# Patient Record
Sex: Male | Born: 2000 | Race: White | Hispanic: No | Marital: Single | Smoking: Never smoker
Health system: Southern US, Community
[De-identification: ages and names within clinical notes are randomized; demographics above are authoritative.]

## PROBLEM LIST (undated history)

## (undated) DIAGNOSIS — F419 Anxiety disorder, unspecified: Secondary | ICD-10-CM

## (undated) DIAGNOSIS — U071 COVID-19: Secondary | ICD-10-CM

---
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
## 2022-07-01 ENCOUNTER — Encounter: Payer: Self-pay | Admitting: Radiology

## 2022-11-20 IMAGING — DX DG ANKLE COMPLETE 3+V*R*
3 series · 3 of 3 positions shown · non-contrast
Comparison: 10/26/2020

CLINICAL DATA: Postreduction

EXAM:
RIGHT ANKLE - COMPLETE 3+ VIEW

[ankle ap]
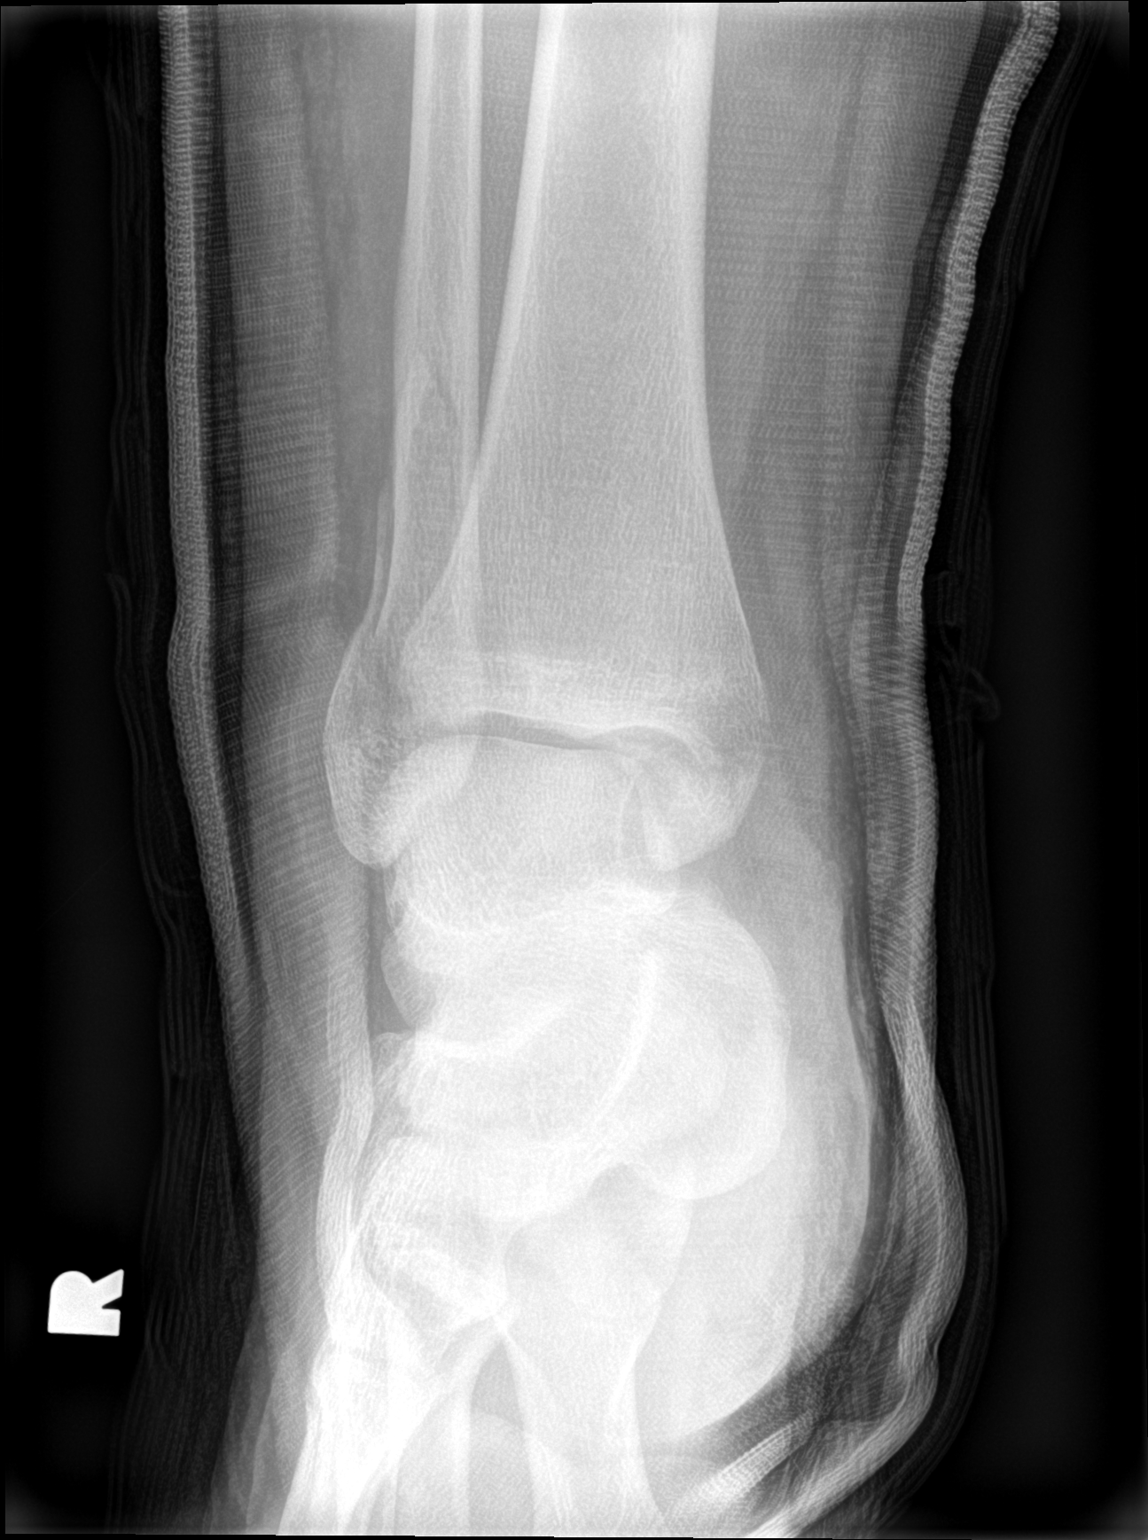

[ankle obl]
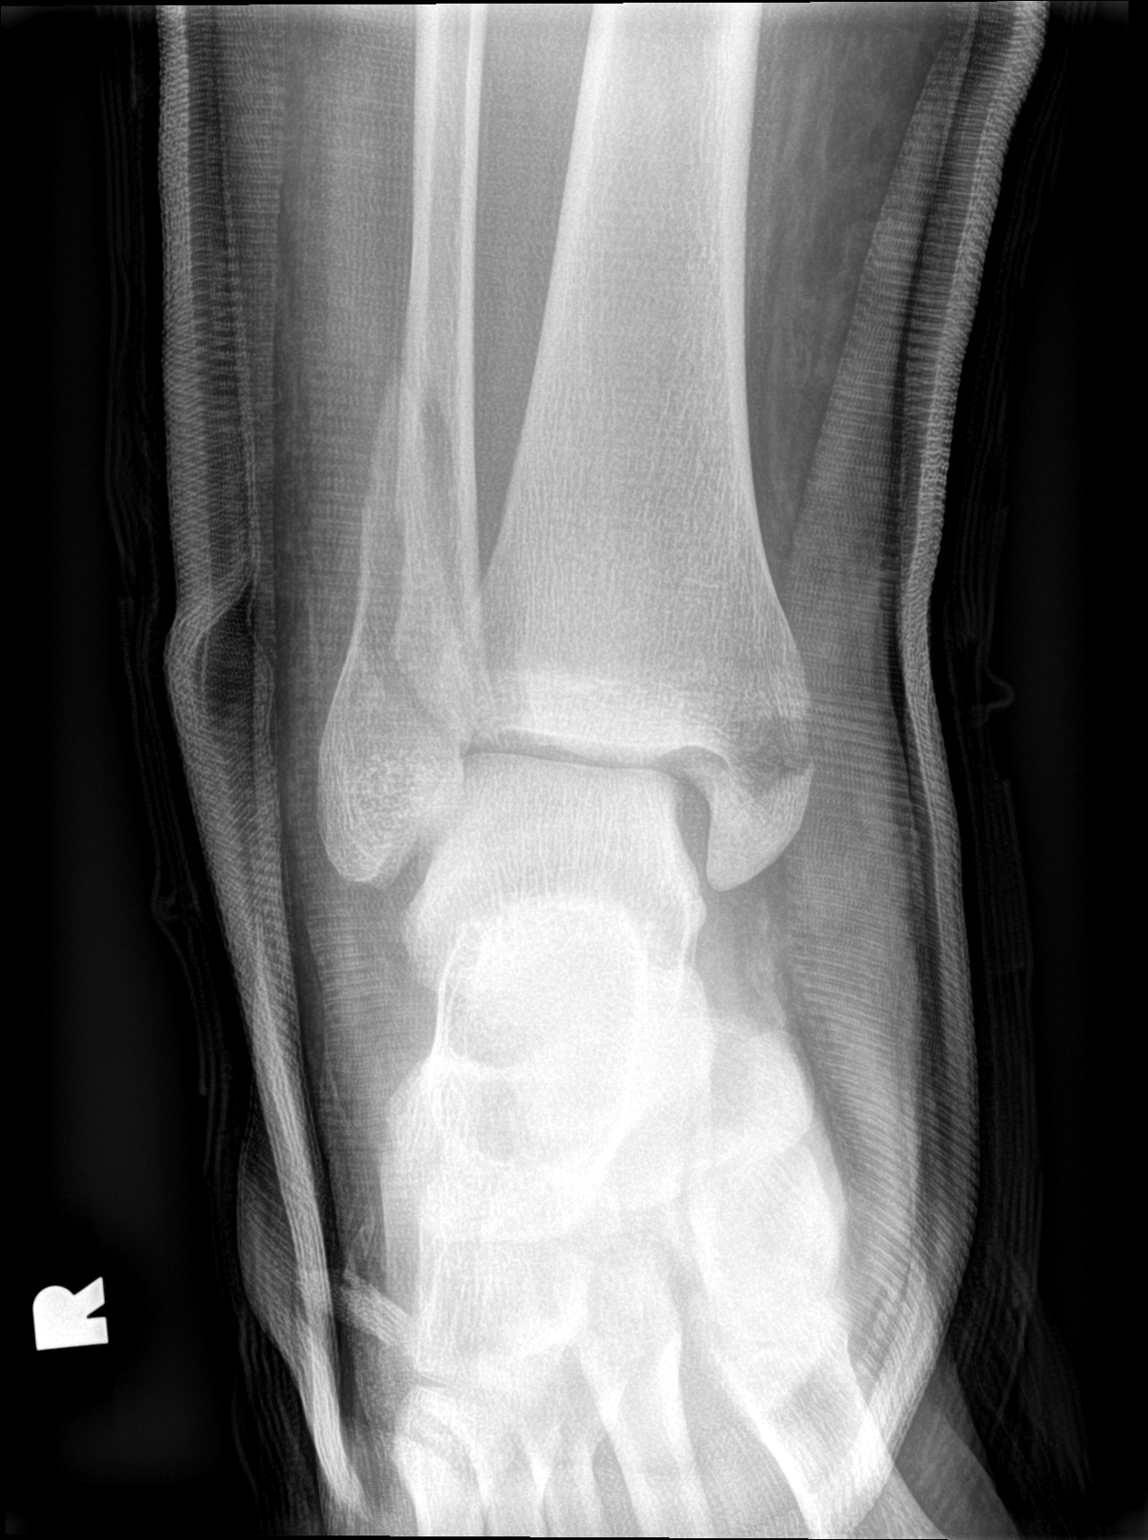

[ankle lat]
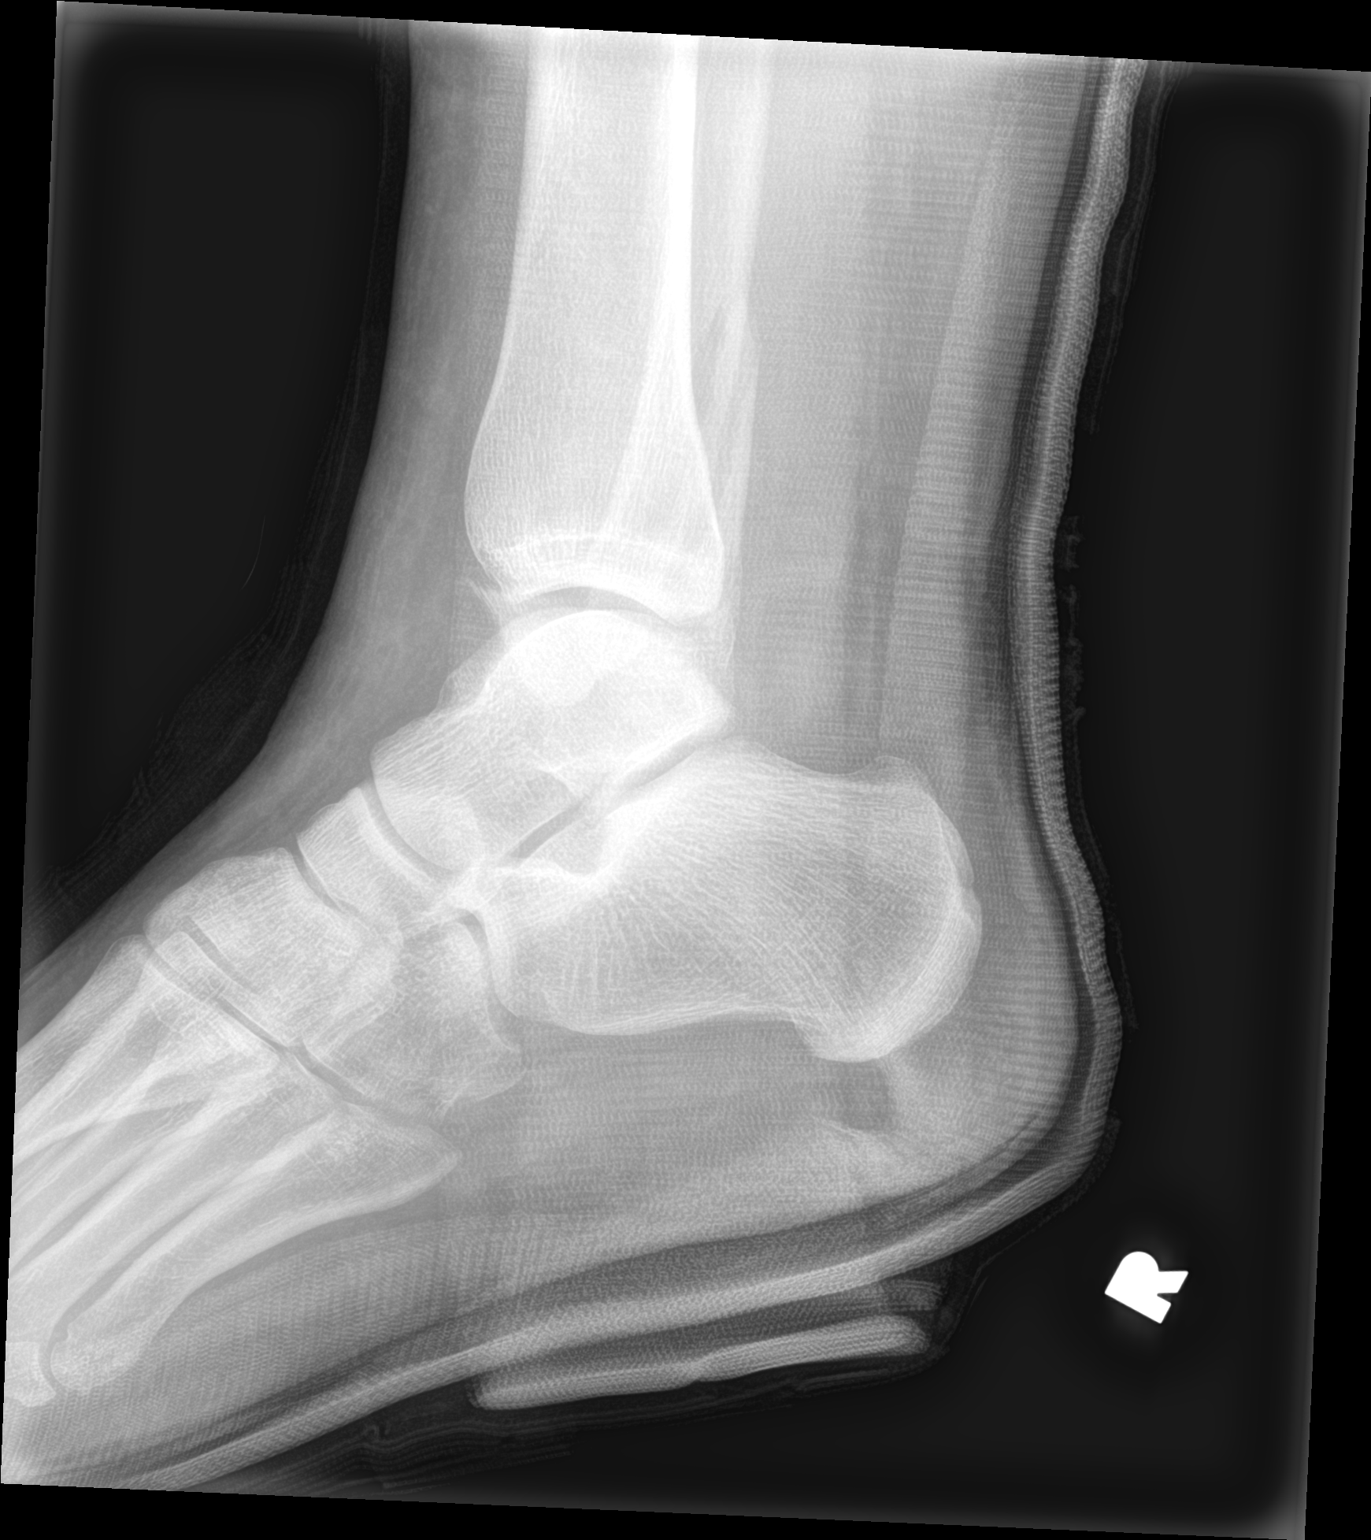

[3 of 3 positions shown; findings below may reference images not displayed]

FINDINGS: In splint views of the right ankle demonstrate interval reduction of
the previously seen distal tibia and fibular fractures with
displacement. Improving alignment with continued mild displacement.
IMPRESSION: Improving alignment with mild continued displacement of the distal
fibular and medial malleolar fractures.

## 2023-05-13 ENCOUNTER — Other Ambulatory Visit: Payer: Self-pay

## 2023-05-13 ENCOUNTER — Emergency Department (HOSPITAL_BASED_OUTPATIENT_CLINIC_OR_DEPARTMENT_OTHER): Payer: BC Managed Care – PPO | Admitting: Radiology

## 2023-05-13 ENCOUNTER — Emergency Department (HOSPITAL_BASED_OUTPATIENT_CLINIC_OR_DEPARTMENT_OTHER)
Admission: EM | Admit: 2023-05-13 | Discharge: 2023-05-13 | Disposition: A | Discharge status: Home / Self Care | Payer: BC Managed Care – PPO | Attending: Emergency Medicine | Admitting: Emergency Medicine

## 2023-05-13 DIAGNOSIS — R079 Chest pain, unspecified: Secondary | ICD-10-CM | POA: Diagnosis present

## 2023-05-13 DIAGNOSIS — R7401 Elevation of levels of liver transaminase levels: Secondary | ICD-10-CM | POA: Diagnosis not present

## 2023-05-13 DIAGNOSIS — R0789 Other chest pain: Secondary | ICD-10-CM | POA: Insufficient documentation

## 2023-05-13 DIAGNOSIS — R000 Tachycardia, unspecified: Secondary | ICD-10-CM | POA: Diagnosis not present

## 2023-05-13 LAB — D-DIMER, QUANTITATIVE: D-Dimer, Quant: <0.27 ug{FEU}/mL (ref 0.00–0.50)

## 2023-05-13 LAB — LIPASE, BLOOD: Lipase: 16 U/L (ref 11–51)

## 2023-05-13 LAB — COMPREHENSIVE METABOLIC PANEL
ALT: 87 U/L — ABNORMAL HIGH (ref 0–44)
AST: 42 U/L — ABNORMAL HIGH (ref 15–41)
Albumin: 5.2 g/dL — ABNORMAL HIGH (ref 3.5–5.0)
Alkaline Phosphatase: 88 U/L (ref 38–126)
Anion gap: 13 (ref 5–15)
BUN: 10 mg/dL (ref 6–20)
CO2: 23 mmol/L (ref 22–32)
Calcium: 10.3 mg/dL (ref 8.9–10.3)
Chloride: 100 mmol/L (ref 98–111)
Creatinine, Ser: 1.07 mg/dL (ref 0.61–1.24)
GFR, Estimated: >60 mL/min (ref >60)
Glucose, Bld: 106 mg/dL — ABNORMAL HIGH (ref 70–99)
Potassium: 3.7 mmol/L (ref 3.5–5.1)
Sodium: 136 mmol/L (ref 135–145)
Total Bilirubin: 1.1 mg/dL (ref 0.0–1.2)
Total Protein: 8.1 g/dL (ref 6.5–8.1)

## 2023-05-13 LAB — CBC
HCT: 49.2 % (ref 39.0–52.0)
Hemoglobin: 17.5 g/dL — ABNORMAL HIGH (ref 13.0–17.0)
MCH: 29.3 pg (ref 26.0–34.0)
MCHC: 35.6 g/dL (ref 30.0–36.0)
MCV: 82.3 fL (ref 80.0–100.0)
Platelets: 370 10*3/uL (ref 150–400)
RBC: 5.98 MIL/uL — ABNORMAL HIGH (ref 4.22–5.81)
RDW: 12.5 % (ref 11.5–15.5)
WBC: 10.1 10*3/uL (ref 4.0–10.5)
nRBC: 0 % (ref 0.0–0.2)

## 2023-05-13 LAB — TROPONIN I (HIGH SENSITIVITY): Troponin I (High Sensitivity): 3 ng/L (ref <18)

## 2023-05-13 MED ORDER — ACETAMINOPHEN 500 MG PO TABS
1000.0000 mg | ORAL_TABLET | Freq: Once | ORAL | Status: AC
  Administered 2023-05-13: 1000 mg via ORAL
  Filled 2023-05-13: qty 2

## 2023-05-13 MED ORDER — ALUM & MAG HYDROXIDE-SIMETH 200-200-20 MG/5ML PO SUSP
30.0000 mL | Freq: Once | ORAL | Status: AC
  Administered 2023-05-13: 30 mL via ORAL
  Filled 2023-05-13: qty 30

## 2023-05-13 MED ORDER — LORAZEPAM 2 MG/ML IJ SOLN
1.0000 mg | Freq: Once | INTRAMUSCULAR | Status: AC
  Administered 2023-05-13: 1 mg via INTRAVENOUS
  Filled 2023-05-13: qty 1

## 2023-05-13 MED ORDER — LIDOCAINE VISCOUS HCL 2 % MT SOLN
15.0000 mL | Freq: Once | OROMUCOSAL | Status: AC
  Administered 2023-05-13: 15 mL via ORAL
  Filled 2023-05-13: qty 15

## 2023-05-13 NOTE — Discharge Instructions (Addendum)
 Your workup today is reassuring. It is very unlikely that your pain is due to an issue in your heart.  Your cardiac enzyme (troponin) was normal today. Your EKG which is a measure of the heart's electrical activity is normal today aside from a slightly faster rate than normal, but this appears to be your baseline. These would both show abnormalities if you were having a heart attack.  Your D dimer is normal. This means there are no signs of a blood clot.  Your chest x-ray is normal today. Your abdominal x-ray from earlier shows no signs of a bowel obstruction.  You had a slight elevation in your liver enzymes today.  However, it appears your PCP is already aware of this. Please continue to have this followed by your PCP. Your blood counts, electrolytes, kidney, and pancreas labs are normal.  Drink plenty of fluids at home and eat a fiber fillet diet (lots of fruits and vegetables). Please engage in daily exercise as this also helps to maintain regular bowel movements.   Try to use the bathroom after eating a meal. Place your feet up on a small stepstool when trying to have a bowel movement.  Take 3 scoops of Miralax in the morning and 3 in the evening until you have a bowel movement. You may also take Ducolax (Bisacodyl) 2 tablets (10mg ) once daily in the morning. These are medications you can get over the counter at any drugstore.   Please follow up with your PCP within the next month to address strategies to prevent constipation in the future.   Return to the ER if you have any difficulty breathing, worsening chest pain, dizziness, jaw pain, left arm or shoulder pain, abdominal pain, fever, if you do not have a bowel movement within the next 3 days, you begin vomiting, any other new or concerning symptoms.

## 2023-05-13 NOTE — ED Triage Notes (Signed)
 Lower abd x1 week. CP x2 days. PCP abd Xray negative. Sent for follow-up. Recently start maunjaro.

## 2023-05-13 NOTE — ED Provider Notes (Signed)
 [place] [facility] AT [facility] Provider Note   CSN: [id number] Arrival date & time: 05/13/23  1459     History  Chief Complaint  Patient presents with   Abdominal Pain    [facility] is a 23 y.o. male with history of anxiety, presents with concern for not being able to have a bowel movement for the past 8 days or so. Reports he has not been passing gas. Reports decreased appetite but denies any nausea or vomiting. Took Colace yesterday without improvement of symptoms.  Reports intermittent generalized abdominal pain, but felt this was related to recently starting Mounjaro about 3 months ago.  Was at PCP earlier today and received abdominal x-ray which showed no acute abnormalities, but was recommended to come here for further evaluation.  Also reports concern for left-sided chest pain that started about 2 days ago.  Nonexertional, nonpleuritic.  Does not change with position.  Patient states he feels like this is due to his anxiety. Denies any shortness of breath, leg pain or swelling, denies any history of blood clots or recent periods of immobilization.   HPI     Home Medications Prior to Admission medications   Medication Sig Start Date End Date Taking? Authorizing Provider  HYDROcodone -acetaminophen  (NORCO/VICODIN) 5-325 MG tablet Take 1 tablet by mouth every 6 (six) hours as needed for severe pain. 10/26/20   Joldersma, Logan, PA-C      Allergies    Patient has no known allergies.    Review of Systems   Review of Systems  Respiratory:  Negative for shortness of breath.   Cardiovascular:  Positive for chest pain.  Gastrointestinal:  Positive for abdominal pain.    Physical Exam Updated Vital Signs BP (!) 127/96   Pulse (!) 104   Resp 14   SpO2 100%  Physical Exam Vitals and nursing note reviewed.  Constitutional:      General: He is not in acute distress.    Appearance: He is well-developed.     Comments: Anxious appearing  HENT:      Head: Normocephalic and atraumatic.  Eyes:     Conjunctiva/sclera: Conjunctivae normal.  Cardiovascular:     Rate and Rhythm: Regular rhythm. Tachycardia present.     Heart sounds: No murmur heard. Pulmonary:     Effort: Pulmonary effort is normal. No respiratory distress.     Breath sounds: Normal breath sounds.  Abdominal:     Palpations: Abdomen is soft.     Tenderness: There is no abdominal tenderness.     Comments: Abdomen soft and non-tender   Musculoskeletal:        General: No swelling.     Cervical back: Neck supple.  Skin:    General: Skin is warm and dry.     Capillary Refill: Capillary refill takes less than 2 seconds.  Neurological:     Mental Status: He is alert.  Psychiatric:        Mood and Affect: Mood normal.     ED Results / Procedures / Treatments   Labs (all labs ordered are listed, but only abnormal results are displayed) Labs Reviewed  CBC - Abnormal; Notable for the following components:      Result Value   RBC 5.98 (*)    Hemoglobin 17.5 (*)    All other components within normal limits  COMPREHENSIVE METABOLIC PANEL - Abnormal; Notable for the following components:   Glucose, Bld 106 (*)    Albumin 5.2 (*)    AST  42 (*)    ALT 87 (*)    All other components within normal limits  LIPASE, BLOOD  D-DIMER, QUANTITATIVE  URINALYSIS, ROUTINE W REFLEX MICROSCOPIC  TROPONIN I (HIGH SENSITIVITY)    EKG None  Radiology DG Chest 2 View Result Date: 05/13/2023 CLINICAL DATA:  Chest pain. EXAM: CHEST - 2 VIEW COMPARISON:  None Available. FINDINGS: Bilateral lung fields are clear. Bilateral costophrenic angles are clear. Normal cardio-mediastinal silhouette. No acute osseous abnormalities. The soft tissues are within normal limits. IMPRESSION: No active cardiopulmonary disease. Electronically Signed   By: Ree Molt M.D.   On: 05/13/2023 16:13    Procedures Procedures    Medications Ordered in ED Medications  LORazepam  (ATIVAN ) injection 1  mg (1 mg Intravenous Given 05/13/23 1619)  alum & mag hydroxide-simeth (MAALOX/MYLANTA) 200-200-20 MG/5ML suspension 30 mL (30 mLs Oral Given 05/13/23 1619)    And  lidocaine  (XYLOCAINE ) 2 % viscous mouth solution 15 mL (15 mLs Oral Given 05/13/23 1619)  acetaminophen  (TYLENOL ) tablet 1,000 mg (1,000 mg Oral Given 05/13/23 1618)    ED Course/ Medical Decision Making/ A&P                                 Medical Decision Making Amount and/or Complexity of Data Reviewed Labs: ordered. Radiology: ordered.  Risk OTC drugs. Prescription drug management.     Differential diagnosis includes but is not limited to Constipation, anxiety, ACS, arrhythmia, Cholelithiasis, cholangitis, choledocholithiasis, peptic ulcer, gastritis, gastroenteritis, appendicitis, IBS, IBD, DKA, nephrolithiasis, UTI, pyelonephritis, pancreatitis, diverticulitis, mesenteric ischemia, abdominal aortic aneurysm, small bowel obstruction, volvulus, testicular torsion, ovarian torsion, and females of childbearing age pregnancy   ED Course:  Patient anxious but well appearing. HR elevated at 147, BP elevated at 152/136.  Upon review of prior PCP visits, it appears he struggles with anxiety and feelings of chest pressure often.  Heart rates at the last couple of PCP visits have also been tachycardic at 110, 117, and 160 at visit today.  His heart rate today in the ER at 147 appears to be somewhat at baseline.  CMP does reveal slightly elevated AST and ALT, however, he is being followed by his PCP for elevated LFTs, most recent visit for this was 03/28/2023.  Creatinine within normal limits.  Bilirubin and alk phos within normal limits.  Electrolytes within normal limits.  CBC without any leukocytosis.  Lipase within normal limits.  Abdomen soft nontender upon palpation.  I also reviewed the abdominal x-ray results from earlier today which showed no signs of bowel obstruction.  Very low concern for any acute abdominal pathology.  No  indication for further imaging at this time. Patient does report left-sided chest pain that has been constant for the past 2 days.  EKG with sinus tachycardia, but no signs of ischemia.  Troponin of 3.  Pain nonpleuritic, nonexertional, low concern for ACS at this time.  Chest x-ray without any acute abnormalities. Patient given Maalox, Tylenol  for chest discomfort/pain.  Also given Ativan  for anxiety. Upon re-evaluation, patient heart rate improved to 104.  He states he is normally tachycardic between 90 and 110 at home.  This seems to be his baseline according to PCP notes as well.  D-dimer undetectable, no concern for DVT or PE at this time. Patient stable and appropriate for discharge home.   Impression: Anxiety Constipation  Disposition:  The patient was discharged home with instructions to follow-up with PCP for further  management of anxiety.  Discussed use of MiraLAX and Dulcolax at home for constipation. Return precautions given.  Imaging Studies ordered: I ordered imaging studies including chest x-ray I independently visualized the imaging with scope of interpretation limited to determining acute life threatening conditions related to emergency care. Imaging showed no acute abnormalities I agree with the radiologist interpretation   Cardiac Monitoring: / EKG: The patient was maintained on a cardiac monitor.  I personally viewed and interpreted the cardiac monitored which showed an underlying rhythm of: Sinus tachycardia  External records from outside source obtained and reviewed including PCP note from earlier today where HR was in the 160s. Has multiple PCP visits for chest tightness and anxiety. Appears that HR during PCP visits chronically tachycardic  Abdominal x-ray from earlier today reviewed, findings as below: FINDINGS: #  The lungs are clear. No pneumothorax or pleural effusion. Heart and mediastinum unremarkable.  #  No free air evident beneath the diaphragm. No  radiographic evidence of significant constipation. Nonspecific bowel gas pattern. Some gas-filled small bowel loops are noted within the midabdomen.                 Final Clinical Impression(s) / ED Diagnoses Final diagnoses:  Atypical chest pain    Rx / DC Orders ED Discharge Orders     None         Veta Palma, PA-C 05/13/23 1704    Pamella Ozell LABOR, DO 05/13/23 2329

## 2023-08-13 ENCOUNTER — Encounter (HOSPITAL_BASED_OUTPATIENT_CLINIC_OR_DEPARTMENT_OTHER): Payer: Self-pay

## 2023-08-13 ENCOUNTER — Observation Stay (HOSPITAL_BASED_OUTPATIENT_CLINIC_OR_DEPARTMENT_OTHER)
Admission: EM | Admit: 2023-08-13 | Discharge: 2023-08-14 | Disposition: A | Discharge status: Home / Self Care | Attending: Internal Medicine | Admitting: Internal Medicine

## 2023-08-13 ENCOUNTER — Emergency Department (HOSPITAL_BASED_OUTPATIENT_CLINIC_OR_DEPARTMENT_OTHER)

## 2023-08-13 ENCOUNTER — Other Ambulatory Visit: Payer: Self-pay

## 2023-08-13 DIAGNOSIS — A084 Viral intestinal infection, unspecified: Secondary | ICD-10-CM | POA: Diagnosis not present

## 2023-08-13 DIAGNOSIS — Z8616 Personal history of COVID-19: Secondary | ICD-10-CM | POA: Diagnosis not present

## 2023-08-13 DIAGNOSIS — R112 Nausea with vomiting, unspecified: Secondary | ICD-10-CM | POA: Diagnosis present

## 2023-08-13 DIAGNOSIS — D72829 Elevated white blood cell count, unspecified: Secondary | ICD-10-CM

## 2023-08-13 DIAGNOSIS — Z79899 Other long term (current) drug therapy: Secondary | ICD-10-CM | POA: Diagnosis not present

## 2023-08-13 DIAGNOSIS — R42 Dizziness and giddiness: Secondary | ICD-10-CM

## 2023-08-13 HISTORY — DX: Anxiety disorder, unspecified: F41.9

## 2023-08-13 LAB — CBC
HCT: 55.5 % — ABNORMAL HIGH (ref 39.0–52.0)
Hemoglobin: 19.6 g/dL — ABNORMAL HIGH (ref 13.0–17.0)
MCH: 29.1 pg (ref 26.0–34.0)
MCHC: 35.3 g/dL (ref 30.0–36.0)
MCV: 82.5 fL (ref 80.0–100.0)
Platelets: 448 10*3/uL — ABNORMAL HIGH (ref 150–400)
RBC: 6.73 MIL/uL — ABNORMAL HIGH (ref 4.22–5.81)
RDW: 13 % (ref 11.5–15.5)
WBC: 25.9 10*3/uL — ABNORMAL HIGH (ref 4.0–10.5)
nRBC: 0 % (ref 0.0–0.2)

## 2023-08-13 LAB — RESP PANEL BY RT-PCR (RSV, FLU A&B, COVID)  RVPGX2
Influenza A by PCR: NEGATIVE
Influenza B by PCR: NEGATIVE
Resp Syncytial Virus by PCR: NEGATIVE
SARS Coronavirus 2 by RT PCR: NEGATIVE

## 2023-08-13 LAB — BASIC METABOLIC PANEL WITH GFR
Anion gap: 19 — ABNORMAL HIGH (ref 5–15)
BUN: 16 mg/dL (ref 6–20)
CO2: 20 mmol/L — ABNORMAL LOW (ref 22–32)
Calcium: 11 mg/dL — ABNORMAL HIGH (ref 8.9–10.3)
Chloride: 100 mmol/L (ref 98–111)
Creatinine, Ser: 1.1 mg/dL (ref 0.61–1.24)
GFR, Estimated: >60 mL/min (ref >60)
Glucose, Bld: 113 mg/dL — ABNORMAL HIGH (ref 70–99)
Potassium: 3.6 mmol/L (ref 3.5–5.1)
Sodium: 139 mmol/L (ref 135–145)

## 2023-08-13 LAB — LACTIC ACID, PLASMA: Lactic Acid, Venous: 1.8 mmol/L (ref 0.5–1.9)

## 2023-08-13 LAB — TROPONIN I (HIGH SENSITIVITY): Troponin I (High Sensitivity): 5 ng/L (ref <18)

## 2023-08-13 MED ORDER — SODIUM CHLORIDE 0.9 % IV BOLUS
1000.0000 mL | Freq: Once | INTRAVENOUS | Status: AC
  Administered 2023-08-13: 1000 mL via INTRAVENOUS

## 2023-08-13 MED ORDER — DIAZEPAM 5 MG/ML IJ SOLN
5.0000 mg | Freq: Once | INTRAMUSCULAR | Status: AC
  Administered 2023-08-13: 5 mg via INTRAVENOUS
  Filled 2023-08-13: qty 2

## 2023-08-13 MED ORDER — LACTATED RINGERS IV BOLUS
1000.0000 mL | Freq: Once | INTRAVENOUS | Status: AC
  Administered 2023-08-13: 1000 mL via INTRAVENOUS

## 2023-08-13 MED ORDER — ONDANSETRON HCL 4 MG/2ML IJ SOLN
4.0000 mg | Freq: Once | INTRAMUSCULAR | Status: AC
  Administered 2023-08-13: 4 mg via INTRAVENOUS
  Filled 2023-08-13: qty 2

## 2023-08-13 MED ORDER — IOHEXOL 350 MG/ML SOLN
100.0000 mL | Freq: Once | INTRAVENOUS | Status: AC | PRN
  Administered 2023-08-13: 75 mL via INTRAVENOUS

## 2023-08-13 NOTE — ED Triage Notes (Signed)
 Pt reports that he has had dizziness and vomiting with feeling like he is on verge of passing out . Pt is tachycardic during triage. Pt is pale and diaphoretic, as well. Pt has history of anxiety and reports that his anxiety is worse today.

## 2023-08-13 NOTE — ED Provider Notes (Incomplete)
 [place] EM[facility] DEPART[facility] AT [facility] Provider Note   CSN: [id number] Arrival date & time: 08/13/23  2116     History {Add pertinent medical, surgical, social history, OB history to HPI:1} Chief Complaint  Patient presents with   Dizziness    Chad [facility] is a 23 y.o. male.  23 year old male with no reported past medical history presenting to the emergency department today with dizziness.  The patient states that this been going on for the past 48 hours.  The patient states that he had a lot of diarrhea yesterday.  He states that he did have a few loose stools earlier today but started with some vomiting today as well.  He reports that his vomit has been nonbloody and nonbilious.  Denies any blood in his stool or dark stools.  He states that he is not really having a lot of abdominal discomfort with this.  He went to urgent care yesterday and was prescribed meclizine.  He is taking this 3 times a day as well as hydroxyzine and his symptoms have not improved.  He denies any focal weakness, numbness, or tingling.  He states that he is having a mild frontal headache but denies any severe headache or sudden onset headache.  Denies any neck pain or stiffness.  He denies any associated abdominal pain.   Dizziness Associated symptoms: diarrhea and vomiting        Home Medications Prior to Admission medications   Medication Sig Start Date End Date Taking? Authorizing Provider  HYDROcodone-acetaminophen (NORCO/VICODIN) 5-325 MG tablet Take 1 tablet by mouth every 6 (six) hours as needed for severe pain. 10/26/20   Joldersma, Logan, PA-C      Allergies    Patient has no known allergies.    Review of Systems   Review of Systems  Gastrointestinal:  Positive for diarrhea and vomiting.  Neurological:  Positive for dizziness.  All other systems reviewed and are negative.   Physical Exam Updated Vital Signs BP 132/86   Pulse (!) 116   Temp 97.7 F (36.5 C) (Oral)    Resp 12   SpO2 100%  Physical Exam Vitals and nursing note reviewed.   Gen: NAD Eyes: PERRL, EOMI HEENT: no oropharyngeal swelling, dry mucous membranes Neck: trachea midline, no meningismus Resp: clear to auscultation bilaterally Card: RRR, no murmurs, rubs, or gallops Abd: nontender, nondistended Extremities: no calf tenderness, no edema Vascular: 2+ radial pulses bilaterally, 2+ DP pulses bilaterally Neuro: cranial nerves intact, equal strength and sensation throughout bilateral upper and lower extremities, no dysmetria on finger-to-nose testing, no dysmetria on heel-to-shin testing Skin: no rashes Psyc: acting appropriately   ED Results / Procedures / Treatments   Labs (all labs ordered are listed, but only abnormal results are displayed) Labs Reviewed  BASIC METABOLIC PANEL WITH GFR - Abnormal; Notable for the following components:      Result Value   CO2 20 (*)    Glucose, Bld 113 (*)    Calcium 11.0 (*)    Anion gap 19 (*)    All other components within normal limits  CBC - Abnormal; Notable for the following components:   WBC 25.9 (*)    RBC 6.73 (*)    Hemoglobin 19.6 (*)    HCT 55.5 (*)    Platelets 448 (*)    All other components within normal limits  RESP PANEL BY RT-PCR (RSV, FLU A&B, COVID)  RVPGX2  LACTIC ACID, PLASMA  LACTIC ACID, PLASMA  RAPID URINE DRUG SCREEN,  HOSP PERFORMED  TROPONIN I (HIGH SENSITIVITY)    EKG EKG Interpretation Date/Time:  Saturday August 13 2023 21:31:46 EDT Ventricular Rate:  162 PR Interval:  98 QRS Duration:  84 QT Interval:  272 QTC Calculation: 446 R Axis:   0  Text Interpretation: Narrow complex tachycardia, no STEMI Otherwise normal ECG When compared with ECG of 13-May-2023 15:19, PREVIOUS ECG IS PRESENT Confirmed by Abner Hoffman [phone]) on 08/13/2023 9:40:11 PM  Radiology No results found.  Procedures Procedures  {Document cardiac monitor, telemetry assessment procedure when appropriate:1}  Medications Ordered  in ED Medications  lactated ringers bolus 1,000 mL (has no administration in time range)  diazepam (VALIUM) injection 5 mg (has no administration in time range)  iohexol (OMNIPAQUE) 350 MG/ML injection 100 mL (has no administration in time range)  sodium chloride 0.9 % bolus 1,000 mL (0 mLs Intravenous Stopped 08/13/23 2252)  ondansetron (ZOFRAN) injection 4 mg (4 mg Intravenous Given 08/13/23 2217)    ED Course/ Medical Decision Making/ A&P   {   Click here for ABCD2, HEART and other calculatorsREFRESH Note before signing :1}                              Medical Decision Making 23 year old male with no reported past medical history presenting to the emergency department today with concern for nausea, vomiting, and diarrhea as well as dizziness.  There is seem to be at least a component of disequilibrium with this.  I do not appreciate any obvious nystagmus to suggest peripheral vertigo.  I will further evaluate the patient here with basic labs to evaluate for anemia or electrolyte abnormalities.  I will give patient IV fluids.  Will give him Valium for his symptoms as he has failed meclizine here.  Will obtain CTA head and neck to evaluate for vertebral artery dissection.  Based on description of his symptoms this does not sound system with subarachnoid hemorrhage.  He does not have any findings on exam consistent with meningitis or encephalitis at this time.  His abdominal exam is very reassuring here as well.  Will give him IV fluids and reevaluate.  Will obtain a viral panel as well as UDS.  I will reevaluate for ultimate disposition.  Amount and/or Complexity of Data Reviewed Labs: ordered. Radiology: ordered.  Risk Prescription drug management.   ***  {Document critical care time when appropriate:1} {Document review of labs and clinical decision tools ie heart score, Chads2Vasc2 etc:1}  {Document your independent review of radiology images, and any outside records:1} {Document your  discussion with family members, caretakers, and with consultants:1} {Document social determinants of health affecting pt's care:1} {Document your decision making why or why not admission, treatments were needed:1} Final Clinical Impression(s) / ED Diagnoses Final diagnoses:  None    Rx / DC Orders ED Discharge Orders     None

## 2023-08-13 NOTE — ED Notes (Signed)
 Patient transported to CT via wheelchair

## 2023-08-14 ENCOUNTER — Encounter (HOSPITAL_COMMUNITY): Payer: Self-pay | Admitting: Internal Medicine

## 2023-08-14 ENCOUNTER — Emergency Department (HOSPITAL_BASED_OUTPATIENT_CLINIC_OR_DEPARTMENT_OTHER)

## 2023-08-14 DIAGNOSIS — R197 Diarrhea, unspecified: Secondary | ICD-10-CM | POA: Diagnosis not present

## 2023-08-14 DIAGNOSIS — Z79899 Other long term (current) drug therapy: Secondary | ICD-10-CM | POA: Diagnosis not present

## 2023-08-14 DIAGNOSIS — Z8616 Personal history of COVID-19: Secondary | ICD-10-CM | POA: Diagnosis not present

## 2023-08-14 DIAGNOSIS — A084 Viral intestinal infection, unspecified: Secondary | ICD-10-CM | POA: Diagnosis not present

## 2023-08-14 DIAGNOSIS — R112 Nausea with vomiting, unspecified: Secondary | ICD-10-CM | POA: Diagnosis present

## 2023-08-14 LAB — CLOSTRIDIUM DIFFICILE BY PCR, REFLEXED: Toxigenic C. Difficile by PCR: POSITIVE — AB

## 2023-08-14 LAB — C DIFFICILE QUICK SCREEN W PCR REFLEX
C Diff antigen: POSITIVE — AB
C Diff toxin: NEGATIVE

## 2023-08-14 LAB — RAPID URINE DRUG SCREEN, HOSP PERFORMED
Amphetamines: NOT DETECTED
Barbiturates: NOT DETECTED
Benzodiazepines: POSITIVE — AB
Cocaine: NOT DETECTED
Opiates: NOT DETECTED
Tetrahydrocannabinol: POSITIVE — AB

## 2023-08-14 LAB — URINALYSIS, ROUTINE W REFLEX MICROSCOPIC
Bilirubin Urine: NEGATIVE
Glucose, UA: NEGATIVE mg/dL
Hgb urine dipstick: NEGATIVE
Ketones, ur: 80 mg/dL — AB
Leukocytes,Ua: NEGATIVE
Nitrite: NEGATIVE
Protein, ur: NEGATIVE mg/dL
Specific Gravity, Urine: 1.043 — ABNORMAL HIGH (ref 1.005–1.030)
pH: 5 (ref 5.0–8.0)

## 2023-08-14 LAB — HIV ANTIBODY (ROUTINE TESTING W REFLEX): HIV Screen 4th Generation wRfx: NONREACTIVE

## 2023-08-14 LAB — TROPONIN I (HIGH SENSITIVITY): Troponin I (High Sensitivity): 15 ng/L (ref <18)

## 2023-08-14 MED ORDER — DROPERIDOL 2.5 MG/ML IJ SOLN
1.2500 mg | Freq: Once | INTRAMUSCULAR | Status: AC
  Administered 2023-08-14: 1.25 mg via INTRAVENOUS
  Filled 2023-08-14: qty 2

## 2023-08-14 MED ORDER — ACETAMINOPHEN 325 MG PO TABS: 650.0000 mg | ORAL_TABLET | Freq: Four times a day (QID) | ORAL | Status: DC | PRN

## 2023-08-14 MED ORDER — LACTATED RINGERS IV BOLUS
1000.0000 mL | Freq: Once | INTRAVENOUS | Status: AC
  Administered 2023-08-14: 1000 mL via INTRAVENOUS

## 2023-08-14 MED ORDER — ACETAMINOPHEN 650 MG RE SUPP: 650.0000 mg | Freq: Four times a day (QID) | RECTAL | Status: DC | PRN

## 2023-08-14 MED ORDER — ORAL CARE MOUTH RINSE: 15.0000 mL | OROMUCOSAL | Status: DC | PRN

## 2023-08-14 MED ORDER — FENTANYL CITRATE PF 50 MCG/ML IJ SOSY: 50.0000 ug | PREFILLED_SYRINGE | Freq: Once | INTRAMUSCULAR | Status: DC

## 2023-08-14 MED ORDER — HYDROXYZINE HCL 25 MG PO TABS: 25.0000 mg | ORAL_TABLET | Freq: Three times a day (TID) | ORAL | Status: DC | PRN

## 2023-08-14 MED ORDER — ONDANSETRON HCL 4 MG/2ML IJ SOLN: 4.0000 mg | Freq: Four times a day (QID) | INTRAMUSCULAR | Status: DC | PRN

## 2023-08-14 MED ORDER — MELATONIN 3 MG PO TABS
3.0000 mg | ORAL_TABLET | Freq: Every day | ORAL | Status: DC
  Administered 2023-08-14: 3 mg via ORAL
  Filled 2023-08-14: qty 1

## 2023-08-14 NOTE — Plan of Care (Signed)

## 2023-08-14 NOTE — Plan of Care (Signed)

## 2023-08-14 NOTE — H&P (Signed)
 History and Physical  Chad Hayes WUJ:[id number] DOB: 09-09-2000 DOA: 08/13/2023  PCP: Tristan Furlough, NP   Chief Complaint: Vomiting, diarrhea, dizziness  HPI: Chad Hayes is a 23 y.o. male with medical history significant for morbid obesity admitted to the hospital with intractable nausea, vomiting, diarrhea.  Patient states he started having some intermittent dizziness with ambulation about 5 or 6 days ago, he thought that he might have a sinus infection so he started taking Augmentin that he had at home after speaking to his PCP.  They started taking antibiotics, he started having worsening dizziness, nausea, and copious watery diarrhea.  No hematemesis, no blood in his stool.  Denies any fevers, or significant abdominal pain.  Never had this issue before.  He went to urgent care and was prescribed meclizine, also taking hydroxyzine but continues to have significant dizziness so came to the ER for evaluation.  Review of Systems: Please see HPI for pertinent positives and negatives. A complete 10 system review of systems are otherwise negative.  Past Medical History:  Diagnosis Date   Anxiety    COVID-19    History reviewed. No pertinent surgical history. Social History:  reports that he has never smoked. He has never used smokeless tobacco. He reports that he does not currently use alcohol. He reports that he does not currently use drugs.  No Known Allergies  No family history on file.   Prior to Admission medications   Medication Sig Start Date End Date Taking? Authorizing Provider  amoxicillin-clavulanate (AUGMENTIN) 875-125 MG tablet Take 1 tablet by mouth daily at 12 noon.   Yes [provider]  hydrOXYzine (ATARAX) 25 MG tablet Take 25 mg by mouth every 8 (eight) hours as needed for anxiety. 08/12/23 08/22/23 Yes [provider]  meclizine (ANTIVERT) 25 MG tablet Take 25 mg by mouth 3 (three) times daily as needed for dizziness. 08/12/23 08/22/23 Yes  [provider]    Physical Exam: BP (!) 145/88 (BP Location: Left Arm)   Pulse (!) 110   Temp 99.9 F (37.7 C) (Oral)   Resp 19   Ht 6\' 2"  (1.88 m)   Wt (!) 143.7 kg   SpO2 97%   BMI 40.69 kg/m  General:  Alert, oriented, calm, in no acute distress, his father is at the bedside. Cardiovascular: RRR, no murmurs or rubs, no peripheral edema  Respiratory: clear to auscultation bilaterally, no wheezes, no crackles  Abdomen: soft, nontender, nondistended, normal bowel tones heard  Skin: dry, no rashes  Musculoskeletal: no joint effusions, normal range of motion  Psychiatric: appropriate affect, normal speech  Neurologic: extraocular muscles intact, clear speech, moving all extremities with intact sensorium         Labs on Admission:  Basic Metabolic Panel: Recent Labs  Lab 08/13/23 2138  NA 139  K 3.6  CL 100  CO2 20*  GLUCOSE 113*  BUN 16  CREATININE 1.10  CALCIUM 11.0*   Liver Function Tests: No results for input(s): "AST", "ALT", "ALKPHOS", "BILITOT", "PROT", "ALBUMIN" in the last 168 hours. No results for input(s): "LIPASE", "AMYLASE" in the last 168 hours. No results for input(s): "AMMONIA" in the last 168 hours. CBC: Recent Labs  Lab 08/13/23 2138  WBC 25.9*  HGB 19.6*  HCT 55.5*  MCV 82.5  PLT 448*   Cardiac Enzymes: No results for input(s): "CKTOTAL", "CKMB", "CKMBINDEX", "TROPONINI" in the last 168 hours. BNP (last 3 results) No results for input(s): "BNP" in the last 8760 hours.  ProBNP (last  3 results) No results for input(s): "PROBNP" in the last 8760 hours.  CBG: No results for input(s): "GLUCAP" in the last 168 hours.  Radiological Exams on Admission: CT ABDOMEN PELVIS WO CONTRAST Result Date: 08/14/2023 CLINICAL DATA:  Abdominal pain, vomiting EXAM: CT ABDOMEN AND PELVIS WITHOUT CONTRAST TECHNIQUE: Multidetector CT imaging of the abdomen and pelvis was performed following the standard protocol without IV contrast. RADIATION DOSE  REDUCTION: This exam was performed according to the departmental dose-optimization program which includes automated exposure control, adjustment of the mA and/or kV according to patient size and/or use of iterative reconstruction technique. COMPARISON:  None Available. FINDINGS: Lower chest: No acute abnormality Hepatobiliary: Diffuse low-density throughout the liver compatible with fatty infiltration. No focal abnormality. Gallbladder unremarkable. Pancreas: No focal abnormality or ductal dilatation. Spleen: No focal abnormality.  Normal size. Adrenals/Urinary Tract: Adrenal glands normal. No renal mass or hydronephrosis. Urinary bladder decompressed. Contrast material noted within the collecting systems, ureters and bladder. Stomach/Bowel: Normal appendix. Stomach, large and small bowel grossly unremarkable. Vascular/Lymphatic: No evidence of aneurysm or adenopathy. Reproductive: No visible focal abnormality. Other: No free fluid or free air. Musculoskeletal: No acute bony abnormality. IMPRESSION: Hepatic steatosis. No acute findings. Electronically Signed   By: Janeece Mechanic M.D.   On: 08/14/2023 02:30   CT ANGIO HEAD NECK W WO CM Result Date: 08/13/2023 CLINICAL DATA:  Follow-up examination for stroke. EXAM: CT ANGIOGRAPHY HEAD AND NECK WITH AND WITHOUT CONTRAST TECHNIQUE: Multidetector CT imaging of the head and neck was performed using the standard protocol during bolus administration of intravenous contrast. Multiplanar CT image reconstructions and MIPs were obtained to evaluate the vascular anatomy. Carotid stenosis measurements (when applicable) are obtained utilizing NASCET criteria, using the distal internal carotid diameter as the denominator. RADIATION DOSE REDUCTION: This exam was performed according to the departmental dose-optimization program which includes automated exposure control, adjustment of the mA and/or kV according to patient size and/or use of iterative reconstruction technique.  CONTRAST:  75mL OMNIPAQUE IOHEXOL 350 MG/ML SOLN COMPARISON:  None Available. FINDINGS: CT HEAD FINDINGS Brain: Cerebral volume within normal limits for patient age. No acute intracranial hemorrhage. No acute large vessel territory infarct. No mass lesion, midline shift, or mass effect. Ventricles are normal in size without hydrocephalus. No extra-axial fluid collection. Vascular: No abnormal hyperdense vessel. Skull: Scalp soft tissues demonstrate no acute abnormality. Calvarium intact. Sinuses/Orbits: Globes and orbital soft tissues within normal limits. Visualized paranasal sinuses are largely clear. No significant mastoid effusion. CTA NECK FINDINGS Aortic arch: Standard branching. Imaged portion shows no evidence of aneurysm or dissection. No significant stenosis of the major arch vessel origins. Right carotid system: No evidence of dissection, stenosis (50% or greater), or occlusion. Left carotid system: No evidence of dissection, stenosis (50% or greater), or occlusion. Vertebral arteries: No evidence of dissection, stenosis (50% or greater), or occlusion. Skeleton: Unremarkable. Other neck: No other acute finding. Upper chest: No other acute finding. Review of the MIP images confirms the above findings CTA HEAD FINDINGS Anterior circulation: Both internal carotid arteries widely patent to the termini without stenosis. A1 segments widely patent. Normal anterior communicating artery complex. Both anterior cerebral arteries widely patent to their distal aspects without stenosis. No M1 stenosis or occlusion. Normal MCA bifurcations. Distal MCA branches well perfused and symmetric. Posterior circulation: Both V4 segments patent without significant stenosis. Left vertebral artery dominant. Left PICA patent. Right PICA not well seen. Basilar patent without stenosis. Superior cerebellar and posterior cerebral arteries patent bilaterally. Venous sinuses: Patent allowing for  timing the contrast bolus. Anatomic  variants: As above.  No aneurysm. Review of the MIP images confirms the above findings IMPRESSION: 1. Normal CTA of the head and neck. No large vessel occlusion, hemodynamically significant stenosis, or other acute vascular abnormality. 2. No other acute intracranial abnormality. Electronically Signed   By: Virgia Griffins M.D.   On: 08/13/2023 23:51   Assessment/Plan Chad Hayes is a 23 y.o. male with medical history significant for morbid obesity admitted to the hospital with intractable nausea, vomiting, diarrhea.  Viral gastroenteritis-I think this is the most likely etiology of his presentation.  Timing does not really fit C. difficile, and he has no abdominal tenderness or colitis on CT scan.  He was taking Wegovy, but that was about 5 or 6 months ago, he discontinued it due to severe constipation.  Patient states he is feeling tremendously better, he feels like he is back to baseline. -He has been admitted for observation to the hospitalist service -Will advance to regular diet -Due to some continued tachycardia, will give him 1000 cc LR this morning -Await C. difficile studies  I anticipate if the patient is able to tolerate a regular diet without significant nausea, and is able to ambulate without significant dizziness, he can likely be discharged home later this afternoon.  Plan of care was discussed in detail with the patient and his father at the bedside this morning.  They understand and agreeable to this.  Patient is very hopeful to be able to discharge home later this afternoon.  DVT prophylaxis: Ambulation    Code Status: Full Code  Consults called: None  Admission status: Observation  Time spent: 49 minutes  Chad Hayes Rickey Charm MD [facility] Pager [phone]  If 7PM-7AM, please contact night-coverage [email or web address] Password [email or web address]  08/14/2023, 8:58 AM

## 2023-08-14 NOTE — Discharge Summary (Signed)
 Discharge Summary  Chad Hayes ZOX:[id number] DOB: [birth year]  PCP: Tristan Furlough, NP  Admit date: 08/13/2023 Discharge date: 08/14/2023  Recommendations for Outpatient Follow-up:  Please follow up with your PCP with CBC and BMP in 1-2 weeks.  Discharge Diagnoses:  Active Hospital Problems   Diagnosis Date Noted   Nausea vomiting and diarrhea 08/14/2023    Resolved Hospital Problems  No resolved problems to display.   Discharge Condition: Stable   Diet recommendation: Diet Orders (From admission, onward)     Start     Ordered   08/14/23 0851  Diet regular Room service appropriate? Yes; Fluid consistency: Thin  Diet effective now       Question Answer Comment  Room service appropriate? Yes   Fluid consistency: Thin      08/14/23 0850           HPI and Brief Hospital Course:  Chad Hayes is a 23 y.o. male with medical history significant for morbid obesity admitted to the hospital with intractable nausea, vomiting, diarrhea.  Patient states he started having some intermittent dizziness with ambulation about 5 or 6 days ago, he thought that he might have a sinus infection so he started taking Augmentin that he had at home after speaking to his PCP.  They started taking antibiotics, he started having worsening dizziness, nausea, and copious watery diarrhea.  No hematemesis, no blood in his stool.  Denies any fevers, or significant abdominal pain.  Never had this issue before.  He went to urgent care and was prescribed meclizine, also taking hydroxyzine but continues to have significant dizziness so came to the ER for evaluation.    Workup revealed leukocytosis, but otherwise lab work was unrevealing.  He had CT of the abdomen and pelvis which was also unrevealing for any acute process.  After aggressive IV fluid resuscitation, the patient stated that he felt tremendously better.  When I saw him early this morning, he was already feeling ready to go home.  He had very  little bowel movements, he had no further nausea.  After I saw him this morning, he ate a couple of meals which he tolerated well.  He has been ambulating in the room and in the halls multiple times per RN staff, requesting if he can discharge home from the hospital.   He does still remain tachycardic, but asymptomatic from this.  I feel that this is most likely due to some continued dehydration since he presents after having several days of copious watery diarrhea.  He has significant leukocytosis, but no abdominal pain, no colitis on CT scan and so I have a low index of suspicion for C. difficile colitis even though he was recently on antibiotics.  Interestingly though the antibiotics were started after his dizziness.  In any case, regarding his tachycardia I have a very low index of suspicion for something sinister like a PE, as he has no chest pain, and no hypoxia.  Most likely this is related to dehydration, as well as anxiety which he does have a history of.   This morning, we did discuss potential discharge plans, and all questions were answered to the satisfaction of the patient as well as his father who is at the bedside.  Discharge Exam: BP 135/88 (BP Location: Left Arm)   Pulse (!) 110   Temp 98.4 F (36.9 C) (Oral)   Resp 20   Ht 6\' 2"  (1.88 m)   Wt (!) 143.7 kg   SpO2 99%  BMI 40.69 kg/m  See admission examination from today.  Discharge Instructions You were cared for by a hospitalist during your hospital stay. If you have any questions about your discharge medications or the care you received while you were in the hospital after you are discharged, you can call the unit and asked to speak with the hospitalist on call if the hospitalist that took care of you is not available. Once you are discharged, your primary care physician will handle any further medical issues. Please note that NO REFILLS for any discharge medications will be authorized once you are discharged, as it is  imperative that you return to your primary care physician (or establish a relationship with a primary care physician if you do not have one) for your aftercare needs so that they can reassess your need for medications and monitor your lab values.   Allergies as of 08/14/2023   No Known Allergies      Medication List     STOP taking these medications    amoxicillin-clavulanate 875-125 MG tablet Commonly known as: AUGMENTIN       TAKE these medications    hydrOXYzine 25 MG tablet Commonly known as: ATARAX Take 25 mg by mouth every 8 (eight) hours as needed for anxiety.   meclizine 25 MG tablet Commonly known as: ANTIVERT Take 25 mg by mouth 3 (three) times daily as needed for dizziness.       No Known Allergies   The results of significant diagnostics from this hospitalization (including imaging, microbiology, ancillary and laboratory) are listed below for reference.    Significant Diagnostic Studies: CT ABDOMEN PELVIS WO CONTRAST Result Date: 08/14/2023 CLINICAL DATA:  Abdominal pain, vomiting EXAM: CT ABDOMEN AND PELVIS WITHOUT CONTRAST TECHNIQUE: Multidetector CT imaging of the abdomen and pelvis was performed following the standard protocol without IV contrast. RADIATION DOSE REDUCTION: This exam was performed according to the departmental dose-optimization program which includes automated exposure control, adjustment of the mA and/or kV according to patient size and/or use of iterative reconstruction technique. COMPARISON:  None Available. FINDINGS: Lower chest: No acute abnormality Hepatobiliary: Diffuse low-density throughout the liver compatible with fatty infiltration. No focal abnormality. Gallbladder unremarkable. Pancreas: No focal abnormality or ductal dilatation. Spleen: No focal abnormality.  Normal size. Adrenals/Urinary Tract: Adrenal glands normal. No renal mass or hydronephrosis. Urinary bladder decompressed. Contrast material noted within the collecting  systems, ureters and bladder. Stomach/Bowel: Normal appendix. Stomach, large and small bowel grossly unremarkable. Vascular/Lymphatic: No evidence of aneurysm or adenopathy. Reproductive: No visible focal abnormality. Other: No free fluid or free air. Musculoskeletal: No acute bony abnormality. IMPRESSION: Hepatic steatosis. No acute findings. Electronically Signed   By: Janeece Mechanic M.D.   On: 08/14/2023 02:30   CT ANGIO HEAD NECK W WO CM Result Date: 08/13/2023 CLINICAL DATA:  Follow-up examination for stroke. EXAM: CT ANGIOGRAPHY HEAD AND NECK WITH AND WITHOUT CONTRAST TECHNIQUE: Multidetector CT imaging of the head and neck was performed using the standard protocol during bolus administration of intravenous contrast. Multiplanar CT image reconstructions and MIPs were obtained to evaluate the vascular anatomy. Carotid stenosis measurements (when applicable) are obtained utilizing NASCET criteria, using the distal internal carotid diameter as the denominator. RADIATION DOSE REDUCTION: This exam was performed according to the departmental dose-optimization program which includes automated exposure control, adjustment of the mA and/or kV according to patient size and/or use of iterative reconstruction technique. CONTRAST:  75mL OMNIPAQUE IOHEXOL 350 MG/ML SOLN COMPARISON:  None Available. FINDINGS: CT HEAD FINDINGS Brain:  Cerebral volume within normal limits for patient age. No acute intracranial hemorrhage. No acute large vessel territory infarct. No mass lesion, midline shift, or mass effect. Ventricles are normal in size without hydrocephalus. No extra-axial fluid collection. Vascular: No abnormal hyperdense vessel. Skull: Scalp soft tissues demonstrate no acute abnormality. Calvarium intact. Sinuses/Orbits: Globes and orbital soft tissues within normal limits. Visualized paranasal sinuses are largely clear. No significant mastoid effusion. CTA NECK FINDINGS Aortic arch: Standard branching. Imaged portion  shows no evidence of aneurysm or dissection. No significant stenosis of the major arch vessel origins. Right carotid system: No evidence of dissection, stenosis (50% or greater), or occlusion. Left carotid system: No evidence of dissection, stenosis (50% or greater), or occlusion. Vertebral arteries: No evidence of dissection, stenosis (50% or greater), or occlusion. Skeleton: Unremarkable. Other neck: No other acute finding. Upper chest: No other acute finding. Review of the MIP images confirms the above findings CTA HEAD FINDINGS Anterior circulation: Both internal carotid arteries widely patent to the termini without stenosis. A1 segments widely patent. Normal anterior communicating artery complex. Both anterior cerebral arteries widely patent to their distal aspects without stenosis. No M1 stenosis or occlusion. Normal MCA bifurcations. Distal MCA branches well perfused and symmetric. Posterior circulation: Both V4 segments patent without significant stenosis. Left vertebral artery dominant. Left PICA patent. Right PICA not well seen. Basilar patent without stenosis. Superior cerebellar and posterior cerebral arteries patent bilaterally. Venous sinuses: Patent allowing for timing the contrast bolus. Anatomic variants: As above.  No aneurysm. Review of the MIP images confirms the above findings IMPRESSION: 1. Normal CTA of the head and neck. No large vessel occlusion, hemodynamically significant stenosis, or other acute vascular abnormality. 2. No other acute intracranial abnormality. Electronically Signed   By: Virgia Griffins M.D.   On: 08/13/2023 23:51    Microbiology: Recent Results (from the past 240 hours)  Resp panel by RT-PCR (RSV, Flu A&B, Covid) Anterior Nasal Swab     Status: None   Collection Time: 08/13/23 10:32 PM   Specimen: Anterior Nasal Swab  Result Value Ref Range Status   SARS Coronavirus 2 by RT PCR NEGATIVE NEGATIVE Final    Comment: (NOTE) SARS-CoV-2 target nucleic acids are  NOT DETECTED.  The SARS-CoV-2 RNA is generally detectable in upper respiratory specimens during the acute phase of infection. The lowest concentration of SARS-CoV-2 viral copies this assay can detect is 138 copies/mL. A negative result does not preclude SARS-Cov-2 infection and should not be used as the sole basis for treatment or other patient management decisions. A negative result may occur with  improper specimen collection/handling, submission of specimen other than nasopharyngeal swab, presence of viral mutation(s) within the areas targeted by this assay, and inadequate number of viral copies(<138 copies/mL). A negative result must be combined with clinical observations, patient history, and epidemiological information. The expected result is Negative.  Fact Sheet for Patients:  BloggerCourse.com  Fact Sheet for Healthcare Providers:  SeriousBroker.it  This test is no t yet approved or cleared by the [place]  FDA and  has been authorized for detection and/or diagnosis of SARS-CoV-2 by FDA under an Emergency Use Authorization (EUA). This EUA will remain  in effect (meaning this test can be used) for the duration of the COVID-19 declaration under Section 564(b)(1) of the Act, 21 U.S.C.section 360bbb-3(b)(1), unless the authorization is terminated  or revoked sooner.       Influenza A by PCR NEGATIVE NEGATIVE Final   Influenza B by PCR NEGATIVE NEGATIVE Final  Comment: (NOTE) The Xpert Xpress SARS-CoV-2/FLU/RSV plus assay is intended as an aid in the diagnosis of influenza from Nasopharyngeal swab specimens and should not be used as a sole basis for treatment. Nasal washings and aspirates are unacceptable for Xpert Xpress SARS-CoV-2/FLU/RSV testing.  Fact Sheet for Patients: BloggerCourse.com  Fact Sheet for Healthcare Providers: SeriousBroker.it  This test is not  yet approved or cleared by the [place]  FDA and has been authorized for detection and/or diagnosis of SARS-CoV-2 by FDA under an Emergency Use Authorization (EUA). This EUA will remain in effect (meaning this test can be used) for the duration of the COVID-19 declaration under Section 564(b)(1) of the Act, 21 U.S.C. section 360bbb-3(b)(1), unless the authorization is terminated or revoked.     Resp Syncytial Virus by PCR NEGATIVE NEGATIVE Final    Comment: (NOTE) Fact Sheet for Patients: BloggerCourse.com  Fact Sheet for Healthcare Providers: SeriousBroker.it  This test is not yet approved or cleared by the [place]  FDA and has been authorized for detection and/or diagnosis of SARS-CoV-2 by FDA under an Emergency Use Authorization (EUA). This EUA will remain in effect (meaning this test can be used) for the duration of the COVID-19 declaration under Section 564(b)(1) of the Act, 21 U.S.C. section 360bbb-3(b)(1), unless the authorization is terminated or revoked.  Performed at [facility], [address] [place], [place] [address]      Labs: Basic Metabolic Panel: Recent Labs  Lab 08/13/23 2138  NA 139  K 3.6  CL 100  CO2 20*  GLUCOSE 113*  BUN 16  CREATININE 1.10  CALCIUM 11.0*   Liver Function Tests: No results for input(s): "AST", "ALT", "ALKPHOS", "BILITOT", "PROT", "ALBUMIN" in the last 168 hours. No results for input(s): "LIPASE", "AMYLASE" in the last 168 hours. No results for input(s): "AMMONIA" in the last 168 hours. CBC: Recent Labs  Lab 08/13/23 2138  WBC 25.9*  HGB 19.6*  HCT 55.5*  MCV 82.5  PLT 448*   Cardiac Enzymes: No results for input(s): "CKTOTAL", "CKMB", "CKMBINDEX", "TROPONINI" in the last 168 hours. BNP: BNP (last 3 results) No results for input(s): "BNP" in the last 8760 hours.  ProBNP (last 3 results) No results for input(s): "PROBNP" in the last  8760 hours.  CBG: No results for input(s): "GLUCAP" in the last 168 hours.  Time spent: 20 minutes were spent in preparing this discharge including medication reconciliation, counseling, and coordination of care.  Signed:  Jun Osment Rickey Charm, MD  [facility] 08/14/2023, 2:30 PM

## 2023-08-15 ENCOUNTER — Other Ambulatory Visit: Payer: Self-pay | Admitting: Internal Medicine

## 2023-08-15 NOTE — Plan of Care (Signed)
 Patient is C. difficile antigen positive, toxigenic positive but with little to no toxin production.  I spoke over the phone with Shirley this morning, he continues to have quite a bit of diarrhea, but is otherwise feeling well.  Prescription sent to his pharmacy for vancomycin x 10 days.  Patient was advised to follow-up with his PCP within the next 7 days, or to return to the ER in case of any worsening.
